# Patient Record
Sex: Male | Born: 1985 | Race: Black or African American | Hispanic: No | Marital: Single | Smoking: Never smoker
Health system: Southern US, Community
[De-identification: ages and names within clinical notes are randomized; demographics above are authoritative.]

## PROBLEM LIST (undated history)

## (undated) DIAGNOSIS — F1290 Cannabis use, unspecified, uncomplicated: Secondary | ICD-10-CM

## (undated) DIAGNOSIS — E669 Obesity, unspecified: Secondary | ICD-10-CM

## (undated) DIAGNOSIS — Z720 Tobacco use: Secondary | ICD-10-CM

## (undated) DIAGNOSIS — R519 Headache, unspecified: Secondary | ICD-10-CM

## (undated) HISTORY — PX: VENTRAL HERNIA REPAIR: SHX424

---

## 2010-12-09 ENCOUNTER — Emergency Department: Payer: Self-pay | Admitting: Emergency Medicine

## 2011-01-16 ENCOUNTER — Ambulatory Visit: Payer: Self-pay | Admitting: Family Medicine

## 2012-05-29 IMAGING — CT CT HEAD WITHOUT CONTRAST
1 series · 16 of 29 positions shown, 20 images · non-contrast
Comparison: none

REASON FOR EXAM: Headache Eval  Subdural Hemotoma
COMMENTS:

PROCEDURE:     KCT - KCT HEAD WITHOUT CONTRAST  - January 16, 2011  [DATE]
RESULT:     Comparison:  None
TECHNIQUE: Multiple axial images from the foramen magnum to the vertex were
obtained without IV contrast.

[Series 2: soft tissue · axial · 0.43mm/px · z∈[-136,-6]mm · 16 of 29 slices shown, 20 images]
[im 2/29  brain]
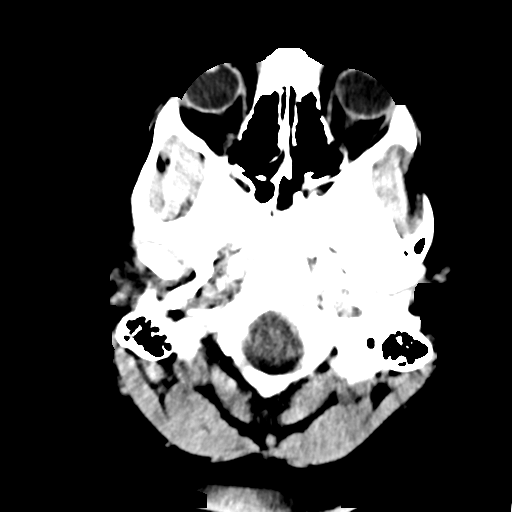
[im 2/29  bone]
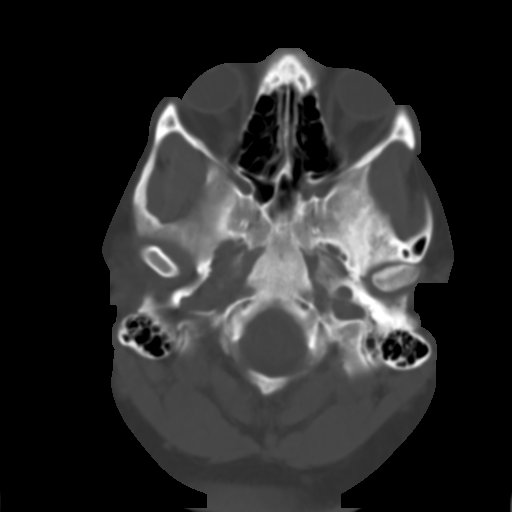
[im 4/29  brain]
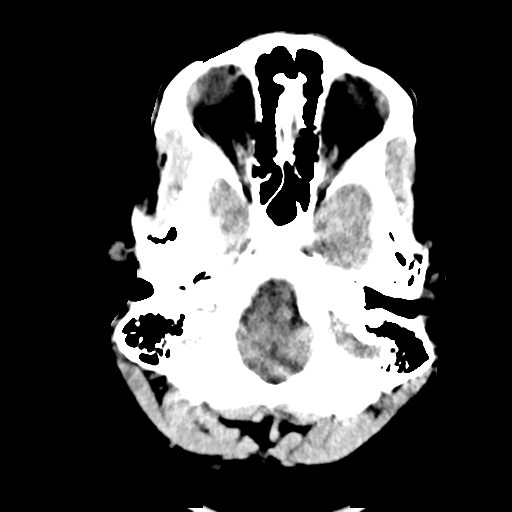
[im 6/29  brain]
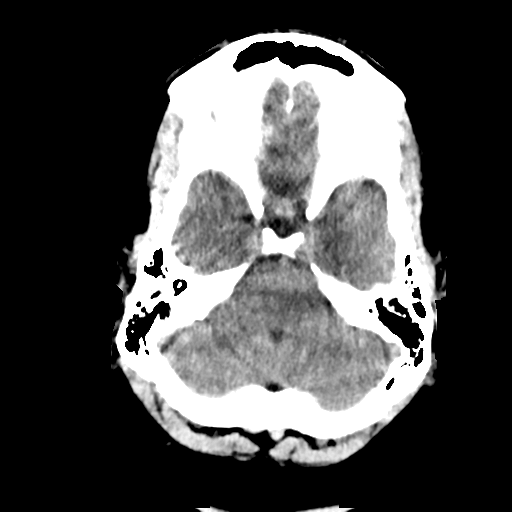
[im 7/29  brain]
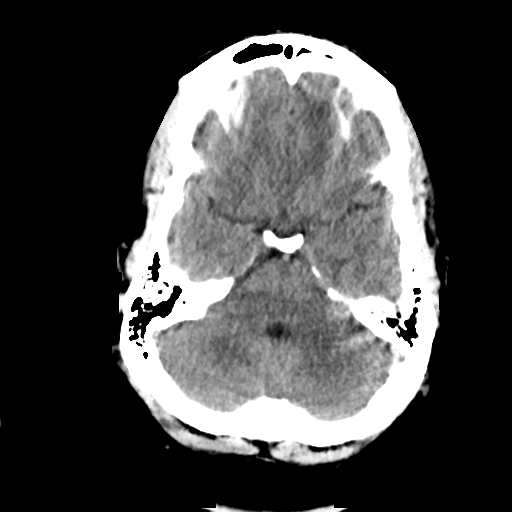
[im 9/29  brain]
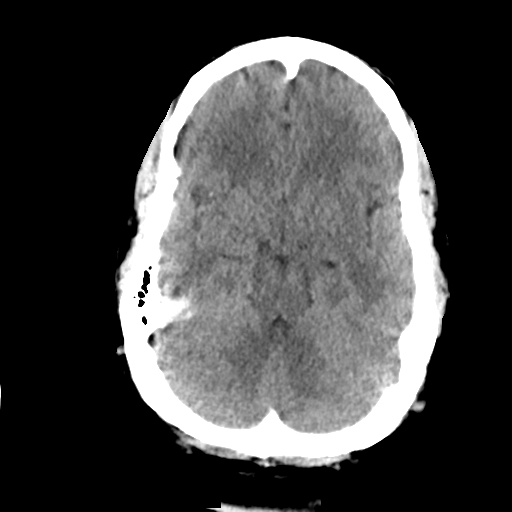
[im 9/29  bone]
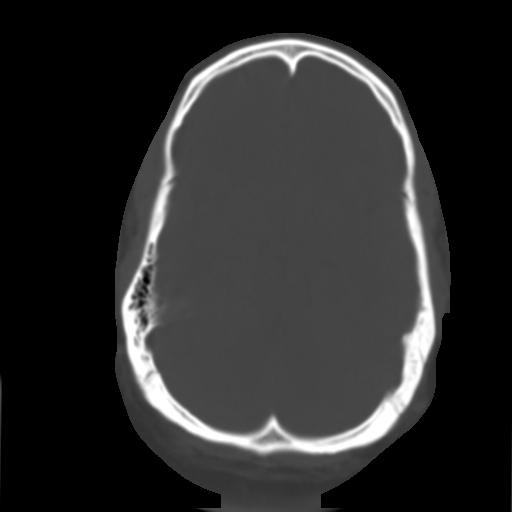
[im 11/29  brain]
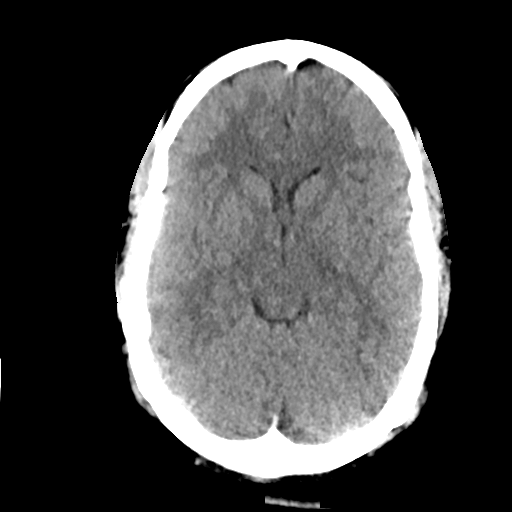
[im 12/29  brain]
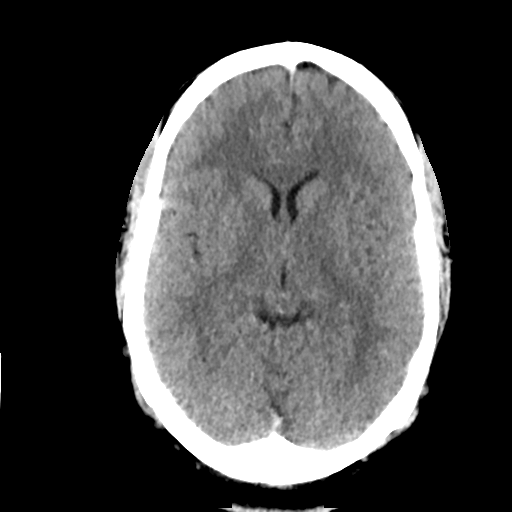
[im 14/29  brain]
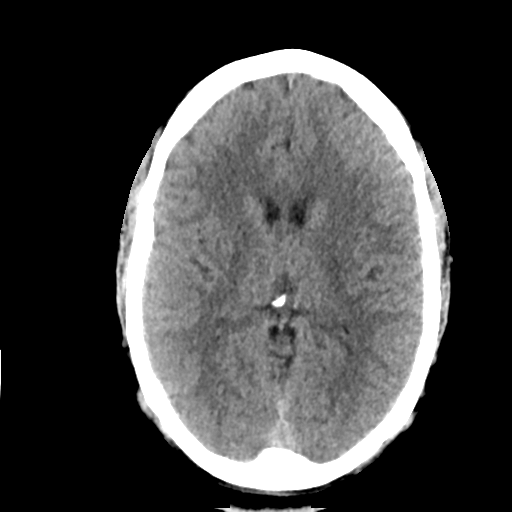
[im 16/29  brain]
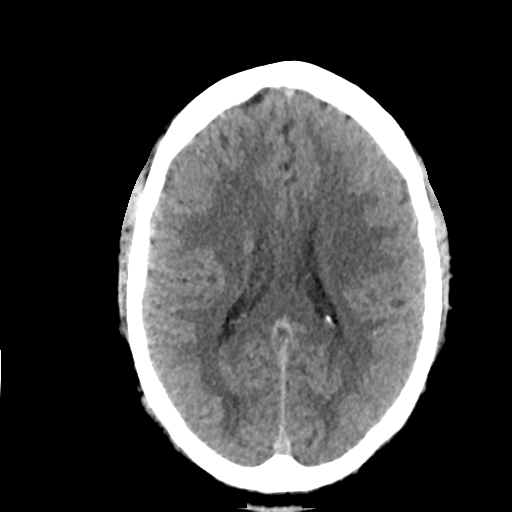
[im 16/29  bone]
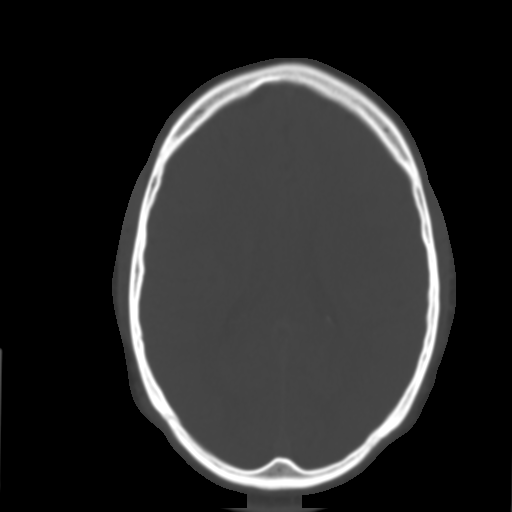
[im 18/29  brain]
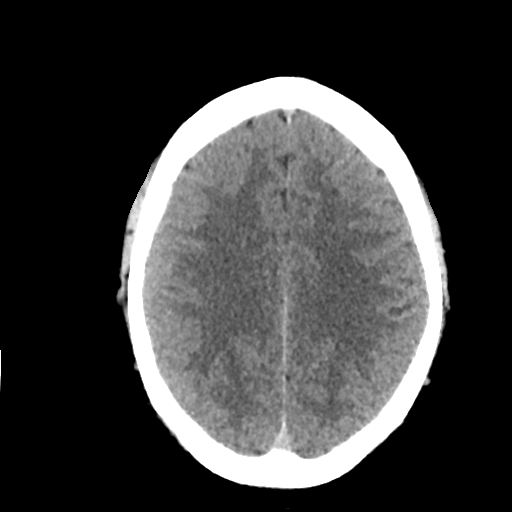
[im 19/29  brain]
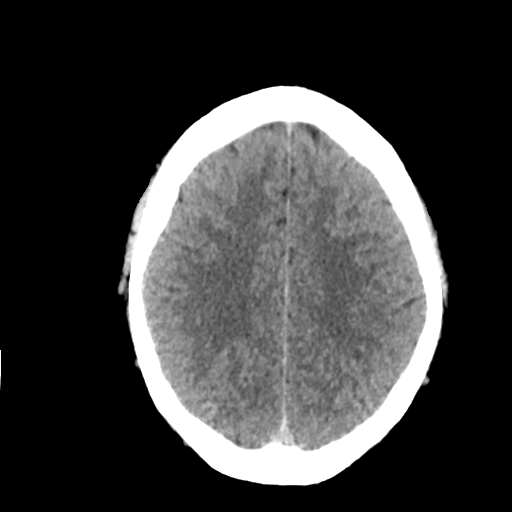
[im 21/29  brain]
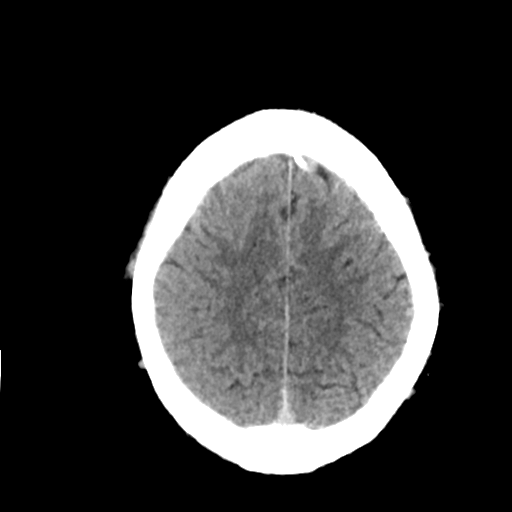
[im 23/29  brain]
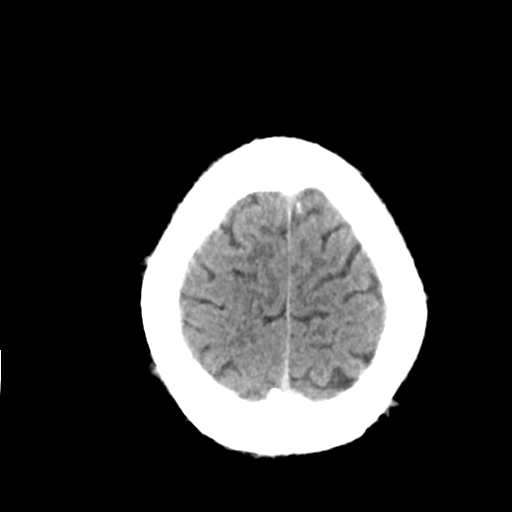
[im 23/29  bone]
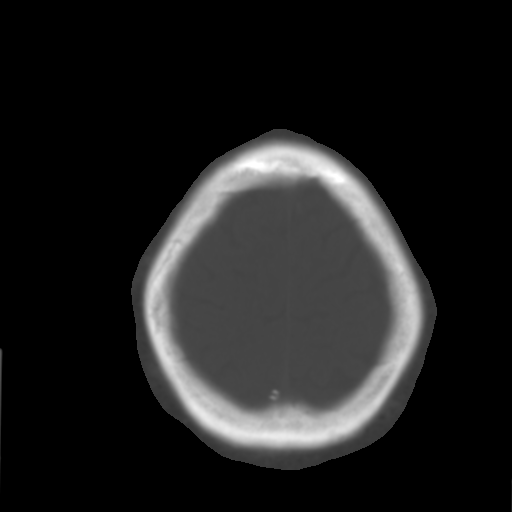
[im 24/29  brain]
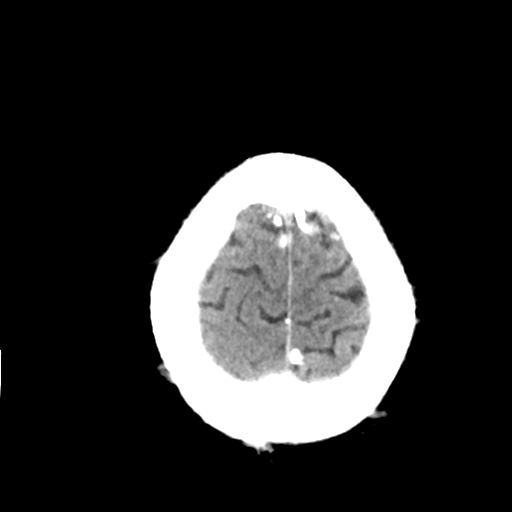
[im 26/29  brain]
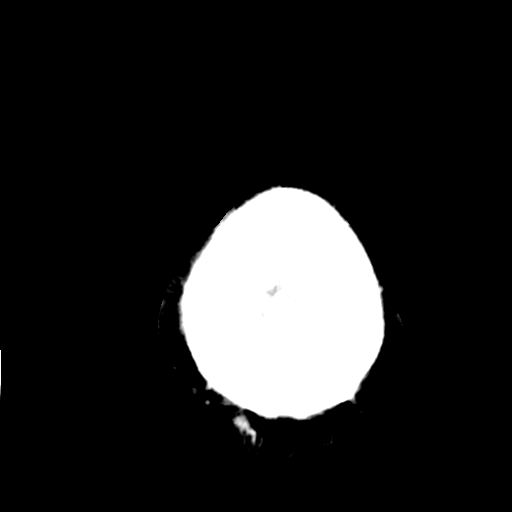
[im 28/29  brain]
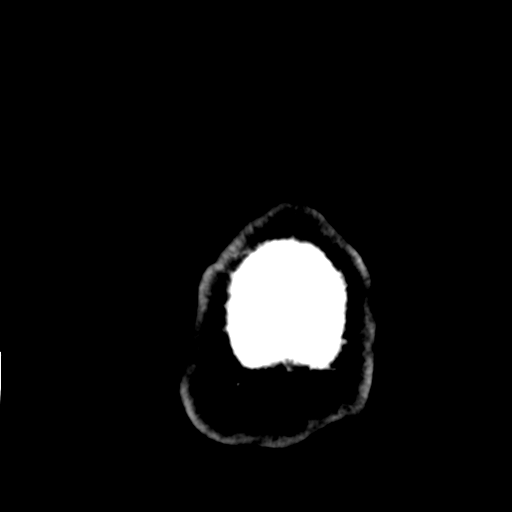

[16 of 29 positions shown; findings below may reference images not displayed]

FINDINGS: There is no evidence of mass effect, midline shift, or extra-axial fluid
collections.  There is no evidence of a space-occupying lesion or
intracranial hemorrhage. There is no evidence of a cortical-based area of
acute infarction.

The ventricles and sulci are appropriate for the patient's age. The basal
cisterns are patent.

Visualized portions of the orbits are unremarkable. The visualized portions
of the paranasal sinuses and mastoid air cells are unremarkable.

The osseous structures are unremarkable.
IMPRESSION: No acute intracranial process.

## 2020-01-09 ENCOUNTER — Ambulatory Visit: Payer: Self-pay | Admitting: Physician Assistant

## 2020-01-09 ENCOUNTER — Other Ambulatory Visit: Payer: Self-pay

## 2020-01-09 DIAGNOSIS — N341 Nonspecific urethritis: Secondary | ICD-10-CM

## 2020-01-09 DIAGNOSIS — Z113 Encounter for screening for infections with a predominantly sexual mode of transmission: Secondary | ICD-10-CM

## 2020-01-09 LAB — GRAM STAIN

## 2020-01-09 MED ORDER — AZITHROMYCIN 500 MG PO TABS
1000.0000 mg | ORAL_TABLET | Freq: Once | ORAL | Status: AC
  Administered 2020-01-09: 16:00:00 1000 mg via ORAL

## 2020-01-10 ENCOUNTER — Encounter: Payer: Self-pay | Admitting: Physician Assistant

## 2020-01-10 NOTE — Progress Notes (Signed)
   [facility] STI clinic/screening visit  Subjective:  Timo Hartwig is a 34 y.o. male being seen today for an STI screening visit. The patient reports they do have symptoms.    Patient has the following medical conditions:  There are no problems to display for this patient.    Chief Complaint  Patient presents with  . SEXUALLY TRANSMITTED DISEASE    HPI  Patient reports having clear/milky discharge from his penis for 3 days.  Denies other symptoms, chronic conditions and medications.   See flowsheet for further details and programmatic requirements.    The following portions of the patient's history were reviewed and updated as appropriate: allergies, current medications, past medical history, past social history, past surgical history and problem list.  Objective:  There were no vitals filed for this visit.  Physical Exam Constitutional:      General: He is not in acute distress.    Appearance: Normal appearance.  HENT:     Head: Normocephalic and atraumatic.     Comments: No nits, lice, or hair loss. No cervical, supraclavicular or axillary adenopathy.    Mouth/Throat:     Mouth: Mucous membranes are moist.     Pharynx: Oropharynx is clear. No oropharyngeal exudate or posterior oropharyngeal erythema.  Eyes:     Conjunctiva/sclera: Conjunctivae normal.  Pulmonary:     Effort: Pulmonary effort is normal.  Abdominal:     Palpations: Abdomen is soft. There is no mass.     Tenderness: There is no abdominal tenderness. There is no guarding or rebound.  Genitourinary:    Penis: Normal.      Testes: Normal.     Comments: Pubic area without nits, lice, edema, erythema, lesions and inguinal adenopathy. Penis circumcised, without lesions, rashes and discharge from meatus. Musculoskeletal:     Cervical back: Neck supple. No tenderness.  Skin:    General: Skin is warm and dry.     Findings: No bruising, erythema, lesion or rash.   Neurological:     Mental Status: He is alert and oriented to person, place, and time.  Psychiatric:        Mood and Affect: Mood normal.        Behavior: Behavior normal.        Thought Content: Thought content normal.        Judgment: Judgment normal.       Assessment and Plan:  Italo Banton is a 34 y.o. male presenting to the [facility] Department for STI screening  1. Screening for STD (sexually transmitted disease) Patient into clinic with symptoms. Rec condoms with all sex.  Await test results.  Counseled that RN will call if needs to RTC for further treatment once results are back.  - Gram stain - HIV [facility] - Syphilis Serology, [facility] - Gonococcus culture  2. NGU (nongonococcal urethritis) Reviewed Gram stain and will treat for NGU with Azithromycin 1 g po DOT. No sex for 7 days and until after partner completes treatment. RTC for re-treatment if vomits < 2 hr after taking medicine.  - azithromycin (ZITHROMAX) tablet 1,000 mg     No follow-ups on file.  No future appointments.  Matt Holmes, PA

## 2020-01-13 LAB — GONOCOCCUS CULTURE

## 2021-12-17 ENCOUNTER — Ambulatory Visit: Payer: Self-pay

## 2023-12-17 ENCOUNTER — Other Ambulatory Visit: Payer: Self-pay

## 2023-12-17 ENCOUNTER — Observation Stay
Admission: EM | Admit: 2023-12-17 | Discharge: 2023-12-19 | Disposition: A | Discharge status: Home / Self Care | Payer: Self-pay | Attending: Emergency Medicine | Admitting: Emergency Medicine

## 2023-12-17 DIAGNOSIS — R519 Headache, unspecified: Secondary | ICD-10-CM | POA: Insufficient documentation

## 2023-12-17 DIAGNOSIS — K922 Gastrointestinal hemorrhage, unspecified: Secondary | ICD-10-CM

## 2023-12-17 DIAGNOSIS — F17200 Nicotine dependence, unspecified, uncomplicated: Secondary | ICD-10-CM | POA: Insufficient documentation

## 2023-12-17 DIAGNOSIS — E669 Obesity, unspecified: Secondary | ICD-10-CM | POA: Insufficient documentation

## 2023-12-17 DIAGNOSIS — R197 Diarrhea, unspecified: Secondary | ICD-10-CM | POA: Insufficient documentation

## 2023-12-17 DIAGNOSIS — K920 Hematemesis: Secondary | ICD-10-CM

## 2023-12-17 DIAGNOSIS — K5730 Diverticulosis of large intestine without perforation or abscess without bleeding: Secondary | ICD-10-CM | POA: Insufficient documentation

## 2023-12-17 DIAGNOSIS — K226 Gastro-esophageal laceration-hemorrhage syndrome: Principal | ICD-10-CM | POA: Insufficient documentation

## 2023-12-17 DIAGNOSIS — Z720 Tobacco use: Secondary | ICD-10-CM | POA: Diagnosis present

## 2023-12-17 HISTORY — DX: Headache, unspecified: R51.9

## 2023-12-17 HISTORY — DX: Obesity, unspecified: E66.9

## 2023-12-17 HISTORY — DX: Tobacco use: Z72.0

## 2023-12-17 HISTORY — DX: Cannabis use, unspecified, uncomplicated: F12.90

## 2023-12-17 LAB — CBC
HCT: 44.7 % (ref 39.0–52.0)
Hemoglobin: 14.9 g/dL (ref 13.0–17.0)
MCH: 29.4 pg (ref 26.0–34.0)
MCHC: 33.3 g/dL (ref 30.0–36.0)
MCV: 88.2 fL (ref 80.0–100.0)
Platelets: 276 10*3/uL (ref 150–400)
RBC: 5.07 MIL/uL (ref 4.22–5.81)
RDW: 12.4 % (ref 11.5–15.5)
WBC: 11 10*3/uL — ABNORMAL HIGH (ref 4.0–10.5)
nRBC: 0 % (ref 0.0–0.2)

## 2023-12-17 LAB — COMPREHENSIVE METABOLIC PANEL
ALT: 30 U/L (ref 0–44)
AST: 25 U/L (ref 15–41)
Albumin: 4.7 g/dL (ref 3.5–5.0)
Alkaline Phosphatase: 64 U/L (ref 38–126)
Anion gap: 11 (ref 5–15)
BUN: 19 mg/dL (ref 6–20)
CO2: 23 mmol/L (ref 22–32)
Calcium: 9.1 mg/dL (ref 8.9–10.3)
Chloride: 103 mmol/L (ref 98–111)
Creatinine, Ser: 1.04 mg/dL (ref 0.61–1.24)
GFR, Estimated: >60 mL/min (ref >60)
Glucose, Bld: 130 mg/dL — ABNORMAL HIGH (ref 70–99)
Potassium: 3.8 mmol/L (ref 3.5–5.1)
Sodium: 137 mmol/L (ref 135–145)
Total Bilirubin: 1.1 mg/dL (ref <1.2)
Total Protein: 8.1 g/dL (ref 6.5–8.1)

## 2023-12-17 LAB — TYPE AND SCREEN
ABO/RH(D): O POS
Antibody Screen: NEGATIVE

## 2023-12-17 MED ORDER — PANTOPRAZOLE SODIUM 40 MG IV SOLR
80.0000 mg | Freq: Once | INTRAVENOUS | Status: AC
  Administered 2023-12-18: 80 mg via INTRAVENOUS
  Filled 2023-12-17: qty 20

## 2023-12-17 MED ORDER — ONDANSETRON HCL 4 MG/2ML IJ SOLN
4.0000 mg | Freq: Once | INTRAMUSCULAR | Status: AC
Start: 2023-12-18 — End: 2023-12-18
  Administered 2023-12-18: 4 mg via INTRAVENOUS
  Filled 2023-12-17: qty 2

## 2023-12-17 NOTE — ED Triage Notes (Signed)
Pt to ed from home via POV for GI Bleed. Pt has been sick recently with N/V and today started throwing up some blood. Pt is caox4, in no acute distress and ambulatory in triage.

## 2023-12-17 NOTE — ED Provider Notes (Signed)
[facility] Provider Note    Event Date/Time   First MD Initiated Contact with Patient 12/17/23 2310     (approximate)   History   Hematemesis   HPI  Gabriel Moore is a 37 y.o. male who presents to the ED for evaluation of Hematemesis   Patient presents for evaluation of recurrent hematemesis in the past 1 day.  Reports about 4 episodes of frank bloody emesis.  Some upper abdominal burning sensation.  Reports a childhood hernia repair but no other intra-abdominal surgical history. Nondrinker.  No EGD  Physical Exam   Triage Vital Signs: ED Triage Vitals  Encounter Vitals Group     BP 12/17/23 1737 (!) 139/98     Systolic BP Percentile --      Diastolic BP Percentile --      Pulse Rate 12/17/23 1737 (!) 119     Resp 12/17/23 1737 17     Temp 12/17/23 1737 99.6 F (37.6 C)     Temp Source 12/17/23 1737 Oral     SpO2 12/17/23 1737 99 %     Weight 12/17/23 1738 270 lb (122.5 kg)     Height 12/17/23 1738 6\' 3"  (1.905 m)     Head Circumference --      Peak Flow --      Pain Score 12/17/23 1736 0     Pain Loc --      Pain Education --      Exclude from Growth Chart --     Most recent vital signs: Vitals:   12/17/23 1737 12/17/23 2313  BP: (!) 139/98 (!) 128/92  Pulse: (!) 119 (!) 116  Resp: 17 16  Temp: 99.6 F (37.6 C) 98.2 F (36.8 C)  SpO2: 99% 100%    General: Awake, no distress.  CV:  Good peripheral perfusion.  Tachycardic Resp:  Normal effort.  Abd:  No distention.  Mild periumbilical and RUQ and LUQ tenderness.  No peritoneal features.  Benign lower abdomen. MSK:  No deformity noted.  Neuro:  No focal deficits appreciated. Other:     ED Results / Procedures / Treatments   Labs (all labs ordered are listed, but only abnormal results are displayed) Labs Reviewed  COMPREHENSIVE METABOLIC PANEL - Abnormal; Notable for the following components:      Result Value   Glucose, Bld 130 (*)    All other components within  normal limits  CBC - Abnormal; Notable for the following components:   WBC 11.0 (*)    All other components within normal limits  CBC - Abnormal; Notable for the following components:   WBC 10.7 (*)    Hemoglobin 12.9 (*)    HCT 38.0 (*)    All other components within normal limits  C DIFFICILE QUICK SCREEN W PCR REFLEX    HEMOGLOBIN AND HEMATOCRIT, BLOOD  LIPASE, BLOOD  PROTIME-INR  APTT  CBC  CBC  CBC  HIV ANTIBODY (ROUTINE TESTING W REFLEX)  BASIC METABOLIC PANEL  POC OCCULT BLOOD, ED  TYPE AND SCREEN    EKG   RADIOLOGY CT abd/pelv interpreted by me without signs of acute pathology  Official radiology report(s): CT ABDOMEN PELVIS W CONTRAST Result Date: 12/18/2023 CLINICAL DATA:  hematemesis, upper abd pain. eval gallstones, perf, etc EXAM: CT ABDOMEN AND PELVIS WITH CONTRAST TECHNIQUE: Multidetector CT imaging of the abdomen and pelvis was performed using the standard protocol following bolus administration of intravenous contrast. RADIATION DOSE REDUCTION: This exam was performed  according to the departmental dose-optimization program which includes automated exposure control, adjustment of the mA and/or kV according to patient size and/or use of iterative reconstruction technique. CONTRAST:  OMNIPAQUE IOHEXOL 300 MG/ML  SOLN COMPARISON:  None Available. FINDINGS: Lower chest: Punctate left lower lobe calcified pulmonary nodule (4:47). Possible tiny hiatal hernia. Hepatobiliary: Vague hypodensity along the falciform ligament likely focal fatty infiltration. No focal liver abnormality. No gallstones, gallbladder wall thickening, or pericholecystic fluid. No biliary dilatation. Pancreas: No focal lesion. Normal pancreatic contour. No surrounding inflammatory changes. No main pancreatic ductal dilatation. Spleen: Normal in size without focal abnormality. Adrenals/Urinary Tract: No adrenal nodule bilaterally. Bilateral kidneys enhance symmetrically. Subcentimeter hypodensity  too small to characterize-no further follow-up indicated. No hydronephrosis. No hydroureter. The urinary bladder is unremarkable. Stomach/Bowel: Stomach is within normal limits. No evidence of bowel wall thickening or dilatation. Colonic diverticulosis. Appendix appears normal. Vascular/Lymphatic: No abdominal aorta or iliac aneurysm. Trace atherosclerotic plaque of the aorta and its branches. No abdominal, pelvic, or inguinal lymphadenopathy. Reproductive: Prostate is unremarkable. Other: No intraperitoneal free fluid. No intraperitoneal free gas. No organized fluid collection. Musculoskeletal: No abdominal wall hernia or abnormality. No suspicious lytic or blastic osseous lesions. No acute displaced fracture. IMPRESSION: 1. Colonic diverticulosis with no acute diverticulitis. 2.  Aortic Atherosclerosis (ICD10-I70.0). Electronically Signed   By: Tish Frederickson M.D.   On: 12/18/2023 00:47    PROCEDURES and INTERVENTIONS:  Procedures  Medications  0.9 %  sodium chloride infusion (has no administration in time range)  ondansetron (ZOFRAN) injection 4 mg (has no administration in time range)  acetaminophen (TYLENOL) tablet 650 mg (has no administration in time range)  morphine (PF) 2 MG/ML injection 2 mg (has no administration in time range)  nicotine (NICODERM CQ - dosed in mg/24 hours) patch 21 mg (has no administration in time range)  sodium chloride 0.9 % bolus 2,000 mL (has no administration in time range)  pantoprazole (PROTONIX) injection 40 mg (has no administration in time range)    Followed by  pantoprazole (PROTONIX) injection 40 mg (has no administration in time range)  pantoprazole (PROTONIX) injection 80 mg (80 mg Intravenous Given 12/18/23 0025)  ondansetron (ZOFRAN) injection 4 mg (4 mg Intravenous Given 12/18/23 0025)  iohexol (OMNIPAQUE) 300 MG/ML solution 100 mL (100 mLs Intravenous Contrast Given 12/18/23 0029)     IMPRESSION / MDM / ASSESSMENT AND PLAN / ED COURSE  I  reviewed the triage vital signs and the nursing notes.  Differential diagnosis includes, but is not limited to, bleeding gastric ulcers, Mallory-Weiss tear, esophageal varices  {Patient presents with symptoms of an acute illness or injury that is potentially life-threatening.  Patient taking NSAIDs at home presents with hematemesis concerning for upper GI bleed.  Persistently tachycardic but hemodynamically stable.  Small hemoglobin drop of 1.5 points.  Normal metabolic panel, LFTs and lipase.  CT obtained due to his pain without evidence of acute pathology.  Start PPI and antiemetics.  Consult with medicine for admission for considerations of EGD in the morning  Clinical Course as of 12/18/23 0220  Sat Dec 18, 2023  0109 I consult with Dr. Clyde Lundborg, who agrees to admit [DS]    Clinical Course User Index [DS] Delton Prairie, MD     FINAL CLINICAL IMPRESSION(S) / ED DIAGNOSES   Final diagnoses:  Upper GI bleed  Hematemesis with nausea     Rx / DC Orders   ED Discharge Orders     None  Note:  This document was prepared using Dragon voice recognition software and may include unintentional dictation errors.   Delton Prairie, MD 12/18/23 [phone]

## 2023-12-18 ENCOUNTER — Observation Stay: Payer: Self-pay | Admitting: Anesthesiology

## 2023-12-18 ENCOUNTER — Emergency Department: Payer: Self-pay

## 2023-12-18 ENCOUNTER — Encounter: Payer: Self-pay | Admitting: Internal Medicine

## 2023-12-18 ENCOUNTER — Encounter
Admission: EM | Disposition: A | Discharge status: Home / Self Care | Payer: Self-pay | Source: Home / Self Care | Attending: Emergency Medicine

## 2023-12-18 DIAGNOSIS — Z720 Tobacco use: Secondary | ICD-10-CM | POA: Diagnosis present

## 2023-12-18 DIAGNOSIS — K922 Gastrointestinal hemorrhage, unspecified: Secondary | ICD-10-CM | POA: Diagnosis present

## 2023-12-18 DIAGNOSIS — R519 Headache, unspecified: Secondary | ICD-10-CM | POA: Diagnosis present

## 2023-12-18 DIAGNOSIS — E669 Obesity, unspecified: Secondary | ICD-10-CM | POA: Diagnosis present

## 2023-12-18 DIAGNOSIS — R197 Diarrhea, unspecified: Secondary | ICD-10-CM | POA: Diagnosis present

## 2023-12-18 DIAGNOSIS — K920 Hematemesis: Secondary | ICD-10-CM

## 2023-12-18 HISTORY — PX: ESOPHAGOGASTRODUODENOSCOPY (EGD) WITH PROPOFOL: SHX5813

## 2023-12-18 LAB — CBC
HCT: 38 % — ABNORMAL LOW (ref 39.0–52.0)
HCT: 33.5 % — ABNORMAL LOW (ref 39.0–52.0)
HCT: 32.2 % — ABNORMAL LOW (ref 39.0–52.0)
Hemoglobin: 12.9 g/dL — ABNORMAL LOW (ref 13.0–17.0)
Hemoglobin: 11.2 g/dL — ABNORMAL LOW (ref 13.0–17.0)
Hemoglobin: 10.9 g/dL — ABNORMAL LOW (ref 13.0–17.0)
MCH: 29.5 pg (ref 26.0–34.0)
MCH: 29.9 pg (ref 26.0–34.0)
MCH: 29.3 pg (ref 26.0–34.0)
MCHC: 33.9 g/dL (ref 30.0–36.0)
MCHC: 33.4 g/dL (ref 30.0–36.0)
MCHC: 33.9 g/dL (ref 30.0–36.0)
MCV: 87 fL (ref 80.0–100.0)
MCV: 89.3 fL (ref 80.0–100.0)
MCV: 86.6 fL (ref 80.0–100.0)
Platelets: 283 10*3/uL (ref 150–400)
Platelets: 225 10*3/uL (ref 150–400)
Platelets: 236 10*3/uL (ref 150–400)
RBC: 4.37 MIL/uL (ref 4.22–5.81)
RBC: 3.75 MIL/uL — ABNORMAL LOW (ref 4.22–5.81)
RBC: 3.72 MIL/uL — ABNORMAL LOW (ref 4.22–5.81)
RDW: 12.5 % (ref 11.5–15.5)
RDW: 12.7 % (ref 11.5–15.5)
RDW: 12.8 % (ref 11.5–15.5)
WBC: 10.7 10*3/uL — ABNORMAL HIGH (ref 4.0–10.5)
WBC: 6.2 10*3/uL (ref 4.0–10.5)
WBC: 6.1 10*3/uL (ref 4.0–10.5)
nRBC: 0 % (ref 0.0–0.2)
nRBC: 0 % (ref 0.0–0.2)
nRBC: 0 % (ref 0.0–0.2)

## 2023-12-18 LAB — BASIC METABOLIC PANEL
Anion gap: 9 (ref 5–15)
BUN: 19 mg/dL (ref 6–20)
CO2: 23 mmol/L (ref 22–32)
Calcium: 7.7 mg/dL — ABNORMAL LOW (ref 8.9–10.3)
Chloride: 103 mmol/L (ref 98–111)
Creatinine, Ser: 0.94 mg/dL (ref 0.61–1.24)
GFR, Estimated: >60 mL/min (ref >60)
Glucose, Bld: 97 mg/dL (ref 70–99)
Potassium: 3.6 mmol/L (ref 3.5–5.1)
Sodium: 135 mmol/L (ref 135–145)

## 2023-12-18 LAB — C DIFFICILE QUICK SCREEN W PCR REFLEX
C Diff antigen: NEGATIVE
C Diff interpretation: NOT DETECTED
C Diff toxin: NEGATIVE

## 2023-12-18 LAB — HEMOGLOBIN AND HEMATOCRIT, BLOOD
HCT: 39.4 % (ref 39.0–52.0)
Hemoglobin: 13.4 g/dL (ref 13.0–17.0)

## 2023-12-18 LAB — CBG MONITORING, ED
Glucose-Capillary: 114 mg/dL — ABNORMAL HIGH (ref 70–99)
Glucose-Capillary: 95 mg/dL (ref 70–99)

## 2023-12-18 LAB — PROTIME-INR
INR: 1.1 (ref 0.8–1.2)
Prothrombin Time: 14.7 s (ref 11.4–15.2)

## 2023-12-18 LAB — HIV ANTIBODY (ROUTINE TESTING W REFLEX): HIV Screen 4th Generation wRfx: NONREACTIVE

## 2023-12-18 LAB — LIPASE, BLOOD: Lipase: 24 U/L (ref 11–51)

## 2023-12-18 LAB — APTT: aPTT: 27 s (ref 24–36)

## 2023-12-18 SURGERY — ESOPHAGOGASTRODUODENOSCOPY (EGD) WITH PROPOFOL
Anesthesia: General

## 2023-12-18 MED ORDER — PROPOFOL 10 MG/ML IV BOLUS
INTRAVENOUS | Status: AC
  Filled 2023-12-18: qty 20

## 2023-12-18 MED ORDER — PIPERACILLIN-TAZOBACTAM 3.375 G IVPB
INTRAVENOUS | Status: AC
  Filled 2023-12-18: qty 50

## 2023-12-18 MED ORDER — DEXMEDETOMIDINE HCL IN NACL 80 MCG/20ML IV SOLN
INTRAVENOUS | Status: DC | PRN
  Administered 2023-12-18: 16 ug via INTRAVENOUS
  Administered 2023-12-18: 4 ug via INTRAVENOUS

## 2023-12-18 MED ORDER — MORPHINE SULFATE (PF) 2 MG/ML IV SOLN: 2.0000 mg | INTRAVENOUS | Status: DC | PRN

## 2023-12-18 MED ORDER — PANTOPRAZOLE SODIUM 40 MG IV SOLR: 40.0000 mg | Freq: Two times a day (BID) | INTRAVENOUS | Status: DC

## 2023-12-18 MED ORDER — SODIUM CHLORIDE 0.9 % IV BOLUS
2000.0000 mL | Freq: Once | INTRAVENOUS | Status: AC
  Administered 2023-12-18: 2000 mL via INTRAVENOUS

## 2023-12-18 MED ORDER — PANTOPRAZOLE INFUSION (NEW) - SIMPLE MED: 8.0000 mg/h | INTRAVENOUS | Status: DC

## 2023-12-18 MED ORDER — ACETAMINOPHEN 325 MG PO TABS
650.0000 mg | ORAL_TABLET | Freq: Four times a day (QID) | ORAL | Status: DC | PRN
  Administered 2023-12-18 (×2): 650 mg via ORAL
  Filled 2023-12-18 (×2): qty 2

## 2023-12-18 MED ORDER — SODIUM CHLORIDE 0.9 % IV SOLN
INTRAVENOUS | Status: AC
  Administered 2023-12-18: 10 mL/h via INTRAVENOUS
  Administered 2023-12-18: 1000 mL via INTRAVENOUS

## 2023-12-18 MED ORDER — ONDANSETRON HCL 4 MG/2ML IJ SOLN: 4.0000 mg | Freq: Three times a day (TID) | INTRAMUSCULAR | Status: DC | PRN

## 2023-12-18 MED ORDER — PANTOPRAZOLE SODIUM 40 MG IV SOLR
40.0000 mg | Freq: Four times a day (QID) | INTRAVENOUS | Status: DC
  Administered 2023-12-18 – 2023-12-19 (×6): 40 mg via INTRAVENOUS
  Filled 2023-12-18 (×6): qty 10

## 2023-12-18 MED ORDER — IOHEXOL 300 MG/ML  SOLN
100.0000 mL | Freq: Once | INTRAMUSCULAR | Status: AC | PRN
  Administered 2023-12-18: 100 mL via INTRAVENOUS

## 2023-12-18 MED ORDER — SODIUM CHLORIDE 0.9 % IV BOLUS
1000.0000 mL | Freq: Once | INTRAVENOUS | Status: DC
Start: 2023-12-18 — End: 2023-12-18

## 2023-12-18 MED ORDER — NICOTINE 21 MG/24HR TD PT24: 21.0000 mg | MEDICATED_PATCH | Freq: Every day | TRANSDERMAL | Status: DC

## 2023-12-18 MED ORDER — PROPOFOL 10 MG/ML IV BOLUS
INTRAVENOUS | Status: DC | PRN
  Administered 2023-12-18 (×2): 20 mg via INTRAVENOUS
  Administered 2023-12-18 (×2): 30 mg via INTRAVENOUS
  Administered 2023-12-18: 20 mg via INTRAVENOUS
  Administered 2023-12-18: 100 mg via INTRAVENOUS

## 2023-12-18 NOTE — Anesthesia Postprocedure Evaluation (Signed)
Anesthesia Post Note  Patient: Jubril Stout  Procedure(s) Performed: ESOPHAGOGASTRODUODENOSCOPY (EGD) WITH PROPOFOL  Patient location during evaluation: PACU Anesthesia Type: General Level of consciousness: awake and alert Pain management: pain level controlled Vital Signs Assessment: post-procedure vital signs reviewed and stable Respiratory status: spontaneous breathing, nonlabored ventilation and respiratory function stable Cardiovascular status: blood pressure returned to baseline and stable Postop Assessment: no apparent nausea or vomiting Anesthetic complications: no   No notable events documented.   Last Vitals:  Vitals:   12/18/23 0947 12/18/23 0957  BP: 114/73 119/78  Pulse: 96 93  Resp: 20 20  Temp:    SpO2: 99% 99%    Last Pain:  Vitals:   12/18/23 0957  TempSrc:   PainSc: 0-No pain                 Foye Deer

## 2023-12-18 NOTE — Transfer of Care (Signed)
Immediate Anesthesia Transfer of Care Note  Patient: Akiel Mares  Procedure(s) Performed: ESOPHAGOGASTRODUODENOSCOPY (EGD) WITH PROPOFOL  Patient Location: PACU and Endoscopy Unit  Anesthesia Type:General  Level of Consciousness: drowsy and patient cooperative  Airway & Oxygen Therapy: Patient Spontanous Breathing and Patient connected to nasal cannula oxygen  Post-op Assessment: Report given to RN and Post -op Vital signs reviewed and stable  Post vital signs: Reviewed and stable  Last Vitals:  Vitals Value Taken Time  BP 102/68 12/18/23 0945  Temp 36.9 C 12/18/23 0937  Pulse 93 12/18/23 0937  Resp 20 12/18/23 0945  SpO2 96 % 12/18/23 0945    Last Pain:  Vitals:   12/18/23 0937  TempSrc: Temporal  PainSc: Asleep         Complications: No notable events documented.

## 2023-12-18 NOTE — Consult Note (Signed)
Consultation  Referring Provider:   Hospitalist   Admit date: 12/17/2023 Consult date: 12/17/2023         Reason for Consultation: Hematemesis              HPI:   Gabriel Moore is a 37 y.o. gentleman with history of obesity, marijuana use, and tobacco use here for hematemesis. States that he had an upset stomach at work and took an aspirin, tums, and ginger ale and then vomited clear emesis. When he got home he vomited blood three times which was initially bright red and then more clot like material. He states he has been taking some NSAIDS for a few weeks due to a cold/sinus issues. Denies any other blood thinners. No significant alcohol use. States family history of colon cancer in second degree relatives. Had a hernia repair as a child. Does not take any prescription medicines. He notes possible dark red stools since he has been in the ED.  Past Medical History:  Diagnosis Date   Headache    Marijuana use    Obesity (BMI 30-39.9)    Tobacco abuse     Past Surgical History:  Procedure Laterality Date   VENTRAL HERNIA REPAIR      Family History  Problem Relation Age of Onset   COPD Mother    Diabetes Father     Social History   Tobacco Use   Smoking status: Every Day    Types: Cigarettes   Smokeless tobacco: Never  Substance Use Topics   Alcohol use: Not Currently    Comment: occasionally   Drug use: Not Currently    Types: Marijuana    Prior to Admission medications   Medication Sig Start Date End Date Taking? Authorizing Provider  aspirin 500 MG EC tablet Take 500 mg by mouth 3 (three) times daily.   Yes [provider]  Cholecalciferol (VITAMIN D3) 50 MCG (2000 UT) TABS Take 1 tablet by mouth daily.   Yes [provider]    Current Facility-Administered Medications  Medication Dose Route Frequency Provider Last Rate Last Admin   0.9 %  sodium chloride infusion   Intravenous Continuous Lorretta Harp, MD 75 mL/hr at 12/18/23 0219 1,000 mL at  12/18/23 4034   acetaminophen (TYLENOL) tablet 650 mg  650 mg Oral Q6H PRN Lorretta Harp, MD   650 mg at 12/18/23 0344   morphine (PF) 2 MG/ML injection 2 mg  2 mg Intravenous Q4H PRN Lorretta Harp, MD       nicotine (NICODERM CQ - dosed in mg/24 hours) patch 21 mg  21 mg Transdermal Daily Lorretta Harp, MD       ondansetron [facility]) injection 4 mg  4 mg Intravenous Q8H PRN Lorretta Harp, MD       pantoprazole (PROTONIX) injection 40 mg  40 mg Intravenous Q6H Otelia Sergeant, RPH   40 mg at 12/18/23 0520   Followed by   Melene Muller ON 12/21/2023] pantoprazole (PROTONIX) injection 40 mg  40 mg Intravenous Q12H Otelia Sergeant, RPH       Current Outpatient Medications  Medication Sig Dispense Refill   aspirin 500 MG EC tablet Take 500 mg by mouth 3 (three) times daily.     Cholecalciferol (VITAMIN D3) 50 MCG (2000 UT) TABS Take 1 tablet by mouth daily.      Allergies as of 12/17/2023 - Reviewed 12/17/2023  Allergen Reaction Noted   Penicillins  01/10/2020     Review of Systems:  All systems reviewed and negative except where noted in HPI.  Review of Systems  Constitutional:  Negative for chills and fever.  Respiratory:  Negative for shortness of breath.   Cardiovascular:  Negative for chest pain.  Gastrointestinal:  Positive for blood in stool and vomiting. Negative for abdominal pain, constipation and diarrhea.  Musculoskeletal:  Negative for joint pain.  Skin:  Negative for rash.  Neurological:  Negative for focal weakness.  Psychiatric/Behavioral:  Negative for substance abuse.   All other systems reviewed and are negative.      Physical Exam:  Vital signs in last 24 hours: Temp:  [98.2 F (36.8 C)-99.6 F (37.6 C)] 98.7 F (37.1 C) (12/28 0734) Pulse Rate:  [92-119] 92 (12/28 0734) Resp:  [14-18] 18 (12/28 0734) BP: (128-146)/(92-98) 131/97 (12/28 0734) SpO2:  [94 %-100 %] 94 % (12/28 0734) Weight:  [122.5 kg] 122.5 kg (12/27 1738) Last BM Date : 12/17/23 General:   Pleasant in  NAD Head:  Normocephalic and atraumatic. Eyes:   No icterus.   Conjunctiva pink. Mouth: Mucosa pink moist, no lesions. Neck:  Supple; no masses felt Lungs: No respiratory distress Abdomen:   Flat, soft, nondistended, nontender Msk:  No clubbing or cyanosis. Strength 5/5. Symmetrical without gross deformities. Neurologic:  Alert and  oriented x4;  No focal deficits Skin:  Warm, dry, pink without significant lesions or rashes. Psych:  Alert and cooperative. Normal affect.  LAB RESULTS: Recent Labs    12/17/23 1737 12/18/23 0023 12/18/23 0127  WBC 11.0*  --  10.7*  HGB 14.9 13.4 12.9*  HCT 44.7 39.4 38.0*  PLT 276  --  283   BMET Recent Labs    12/17/23 1737  NA 137  K 3.8  CL 103  CO2 23  GLUCOSE 130*  BUN 19  CREATININE 1.04  CALCIUM 9.1   LFT Recent Labs    12/17/23 1737  PROT 8.1  ALBUMIN 4.7  AST 25  ALT 30  ALKPHOS 64  BILITOT 1.1   PT/INR Recent Labs    12/18/23 0023  LABPROT 14.7  INR 1.1    STUDIES: CT ABDOMEN PELVIS W CONTRAST Result Date: 12/18/2023 CLINICAL DATA:  hematemesis, upper abd pain. eval gallstones, perf, etc EXAM: CT ABDOMEN AND PELVIS WITH CONTRAST TECHNIQUE: Multidetector CT imaging of the abdomen and pelvis was performed using the standard protocol following bolus administration of intravenous contrast. RADIATION DOSE REDUCTION: This exam was performed according to the departmental dose-optimization program which includes automated exposure control, adjustment of the mA and/or kV according to patient size and/or use of iterative reconstruction technique. CONTRAST:  OMNIPAQUE IOHEXOL 300 MG/ML  SOLN COMPARISON:  None Available. FINDINGS: Lower chest: Punctate left lower lobe calcified pulmonary nodule (4:47). Possible tiny hiatal hernia. Hepatobiliary: Vague hypodensity along the falciform ligament likely focal fatty infiltration. No focal liver abnormality. No gallstones, gallbladder wall thickening, or pericholecystic fluid. No  biliary dilatation. Pancreas: No focal lesion. Normal pancreatic contour. No surrounding inflammatory changes. No main pancreatic ductal dilatation. Spleen: Normal in size without focal abnormality. Adrenals/Urinary Tract: No adrenal nodule bilaterally. Bilateral kidneys enhance symmetrically. Subcentimeter hypodensity too small to characterize-no further follow-up indicated. No hydronephrosis. No hydroureter. The urinary bladder is unremarkable. Stomach/Bowel: Stomach is within normal limits. No evidence of bowel wall thickening or dilatation. Colonic diverticulosis. Appendix appears normal. Vascular/Lymphatic: No abdominal aorta or iliac aneurysm. Trace atherosclerotic plaque of the aorta and its branches. No abdominal, pelvic, or inguinal lymphadenopathy. Reproductive: Prostate is unremarkable. Other: No intraperitoneal free  fluid. No intraperitoneal free gas. No organized fluid collection. Musculoskeletal: No abdominal wall hernia or abnormality. No suspicious lytic or blastic osseous lesions. No acute displaced fracture. IMPRESSION: 1. Colonic diverticulosis with no acute diverticulitis. 2.  Aortic Atherosclerosis (ICD10-I70.0). Electronically Signed   By: Tish Frederickson M.D.   On: 12/18/2023 00:47       Impression / Plan:   37 y/o gentleman with history of NSAID use here with hematemesis concerning for PUD vs mallory weiss tear. No evidence of liver disease.  - will plan on EGD today - NPO - maintain active type and screen - IV PPI BID - transfuse to keep hemoglobin > 7 - further recs after procedure  Merlyn Lot MD, MPH [facility]

## 2023-12-18 NOTE — Anesthesia Preprocedure Evaluation (Addendum)
Anesthesia Evaluation  Patient identified by MRN, date of birth, ID band Patient awake    Reviewed: Allergy & Precautions, H&P , NPO status , Patient's Chart, lab work & pertinent test results  Airway Mallampati: III  TM Distance: >3 FB Neck ROM: full    Dental no notable dental hx.    Pulmonary Current Smoker   Pulmonary exam normal        Cardiovascular negative cardio ROS Normal cardiovascular exam     Neuro/Psych negative neurological ROS  negative psych ROS   GI/Hepatic negative GI ROS,,,(+)     substance abuse  marijuana use  Endo/Other  negative endocrine ROS    Renal/GU negative Renal ROS  negative genitourinary   Musculoskeletal   Abdominal  (+) + obese  Peds  Hematology negative hematology ROS (+)   Anesthesia Other Findings Hematemesis Patient states that he takes high dose of aspirin for headache, 500 mg to 3 times each day recently.  Past Medical History: No date: Headache No date: Marijuana use No date: Obesity (BMI 30-39.9) No date: Tobacco abuse  Past Surgical History: No date: VENTRAL HERNIA REPAIR  BMI    Body Mass Index: 33.75 kg/m      Reproductive/Obstetrics negative OB ROS                             Anesthesia Physical Anesthesia Plan  ASA: 2  Anesthesia Plan: General   Post-op Pain Management: Minimal or no pain anticipated   Induction: Intravenous  PONV Risk Score and Plan: Propofol infusion and TIVA  Airway Management Planned: Natural Airway and Nasal Cannula  Additional Equipment:   Intra-op Plan:   Post-operative Plan:   Informed Consent: I have reviewed the patients History and Physical, chart, labs and discussed the procedure including the risks, benefits and alternatives for the proposed anesthesia with the patient or authorized representative who has indicated his/her understanding and acceptance.     Dental Advisory  Given  Plan Discussed with: CRNA and Surgeon  Anesthesia Plan Comments:         Anesthesia Quick Evaluation

## 2023-12-18 NOTE — H&P (Signed)
History and Physical    Gabriel Moore QIH:[id number] DOB: 1986/03/11 DOA: 12/17/2023  Referring MD/NP/PA:   PCP: Pcp, No   Patient coming from:  The patient is coming from home.     Chief Complaint: Hematemesis  HPI: Gabriel Moore is a 37 y.o. male with medical history significant of headache, tobacco abuse, marijuana use, obesity, who presents with hematemesis.  Patient states that he he has had 4 episodes of nausea and hematemesis with dark blood and blood clot.  He has upper abdominal discomfort which is burning sensation.  He also reports diarrhea in the past 6 days, with 1-3 times of loose stool bowel movement each day.  He did not noticed blood in his stool at home, but had 1 episode of dark stool bowel movement in ED.  No chest pain or SOB.  Patient has mild dry cough.  No symptoms of UTI.  Denies heavy drinking of alcohol.  Patient states that he takes high dose of aspirin for headache, 500 mg to 3 times each day recently.  Data reviewed independently and ED Course: pt was found to have initial hemoglobin 14.9 --> 13.4, GFR> 60, temperature normal, blood pressure 128/92, heart rate 116, RR 16, oxygen saturation 100% on room air.  Patient is placed on MedSurg bed for observation.  Message sent to Dr. Mia Creek for GI for consult.  CT abdomen/pelvis:  1. Colonic diverticulosis with no acute diverticulitis. 2.  Aortic Atherosclerosis (ICD10-I70.0)   EKG:  Not done in ED   Review of Systems:   General: no fevers, chills, no body weight gain,  fatigue HEENT: no blurry vision, hearing changes or sore throat Respiratory: no dyspnea, coughing, wheezing CV: no chest pain, no palpitations GI: has nausea, hematemesis and upper abdominal discomfort, diarrhea GU: no dysuria, burning on urination, increased urinary frequency, hematuria  Ext: no leg edema Neuro: no unilateral weakness, numbness, or tingling, no vision change or hearing loss Skin: no rash, no skin tear. MSK:  No muscle spasm, no deformity, no limitation of range of movement in spin Heme: No easy bruising.  Travel history: No recent long distant travel.   Allergy:  Allergies  Allergen Reactions   Penicillins     Past Medical History:  Diagnosis Date   Headache    Marijuana use    Obesity (BMI 30-39.9)    Tobacco abuse     Past Surgical History:  Procedure Laterality Date   VENTRAL HERNIA REPAIR      Social History:  reports that he has been smoking cigarettes. He has never used smokeless tobacco. He reports that he does not currently use alcohol. He reports that he does not currently use drugs after having used the following drugs: Marijuana.  Family History:  Family History  Problem Relation Age of Onset   COPD Mother    Diabetes Father      Prior to Admission medications   Not on File    Physical Exam: Vitals:   12/17/23 1737 12/17/23 1738 12/17/23 2313  BP: (!) 139/98  (!) 128/92  Pulse: (!) 119  (!) 116  Resp: 17  16  Temp: 99.6 F (37.6 C)  98.2 F (36.8 C)  TempSrc: Oral  Oral  SpO2: 99%  100%  Weight:  122.5 kg   Height:  6\' 3"  (1.905 m)    General: Not in acute distress HEENT:       Eyes: PERRL, EOMI, no jaundice       ENT: No  discharge from the ears and nose, no pharynx injection, no tonsillar enlargement.        Neck: No JVD, no bruit, no mass felt. Heme: No neck lymph node enlargement. Cardiac: S1/S2, RRR, No murmurs, No gallops or rubs. Respiratory: No rales, wheezing, rhonchi or rubs. GI: Soft, nondistended, has mild tenderness in the upper abdomen, no rebound pain, no organomegaly, BS present. GU: No hematuria Ext: No pitting leg edema bilaterally. 1+DP/PT pulse bilaterally. Musculoskeletal: No joint deformities, No joint redness or warmth, no limitation of ROM in spin. Skin: No rashes.  Neuro: Alert, oriented X3, cranial nerves II-XII grossly intact, moves all extremities normally. Psych: Patient is not psychotic, no suicidal or hemocidal  ideation.  Labs on Admission: I have personally reviewed following labs and imaging studies  CBC: Recent Labs  Lab 12/17/23 1737 12/18/23 0023  WBC 11.0*  --   HGB 14.9 13.4  HCT 44.7 39.4  MCV 88.2  --   PLT 276  --    Basic Metabolic Panel: Recent Labs  Lab 12/17/23 1737  NA 137  K 3.8  CL 103  CO2 23  GLUCOSE 130*  BUN 19  CREATININE 1.04  CALCIUM 9.1   GFR: Estimated Creatinine Clearance: 138.5 mL/min (by C-G formula based on SCr of 1.04 mg/dL). Liver Function Tests: Recent Labs  Lab 12/17/23 1737  AST 25  ALT 30  ALKPHOS 64  BILITOT 1.1  PROT 8.1  ALBUMIN 4.7   Recent Labs  Lab 12/18/23 0023  LIPASE 24   No results for input(s): "AMMONIA" in the last 168 hours. Coagulation Profile: Recent Labs  Lab 12/18/23 0023  INR 1.1   Cardiac Enzymes: No results for input(s): "CKTOTAL", "CKMB", "CKMBINDEX", "TROPONINI" in the last 168 hours. BNP (last 3 results) No results for input(s): "PROBNP" in the last 8760 hours. HbA1C: No results for input(s): "HGBA1C" in the last 72 hours. CBG: No results for input(s): "GLUCAP" in the last 168 hours. Lipid Profile: No results for input(s): "CHOL", "HDL", "LDLCALC", "TRIG", "CHOLHDL", "LDLDIRECT" in the last 72 hours. Thyroid Function Tests: No results for input(s): "TSH", "T4TOTAL", "FREET4", "T3FREE", "THYROIDAB" in the last 72 hours. Anemia Panel: No results for input(s): "VITAMINB12", "FOLATE", "FERRITIN", "TIBC", "IRON", "RETICCTPCT" in the last 72 hours. Urine analysis: No results found for: "COLORURINE", "APPEARANCEUR", "LABSPEC", "PHURINE", "GLUCOSEU", "HGBUR", "BILIRUBINUR", "KETONESUR", "PROTEINUR", "UROBILINOGEN", "NITRITE", "LEUKOCYTESUR" Sepsis Labs: @LABRCNTIP (procalcitonin:4,lacticidven:4) )No results found for this or any previous visit (from the past 240 hours).   Radiological Exams on Admission: CT ABDOMEN PELVIS W CONTRAST Result Date: 12/18/2023 CLINICAL DATA:  hematemesis, upper abd  pain. eval gallstones, perf, etc EXAM: CT ABDOMEN AND PELVIS WITH CONTRAST TECHNIQUE: Multidetector CT imaging of the abdomen and pelvis was performed using the standard protocol following bolus administration of intravenous contrast. RADIATION DOSE REDUCTION: This exam was performed according to the departmental dose-optimization program which includes automated exposure control, adjustment of the mA and/or kV according to patient size and/or use of iterative reconstruction technique. CONTRAST:  OMNIPAQUE IOHEXOL 300 MG/ML  SOLN COMPARISON:  None Available. FINDINGS: Lower chest: Punctate left lower lobe calcified pulmonary nodule (4:47). Possible tiny hiatal hernia. Hepatobiliary: Vague hypodensity along the falciform ligament likely focal fatty infiltration. No focal liver abnormality. No gallstones, gallbladder wall thickening, or pericholecystic fluid. No biliary dilatation. Pancreas: No focal lesion. Normal pancreatic contour. No surrounding inflammatory changes. No main pancreatic ductal dilatation. Spleen: Normal in size without focal abnormality. Adrenals/Urinary Tract: No adrenal nodule bilaterally. Bilateral kidneys enhance symmetrically. Subcentimeter hypodensity too  small to characterize-no further follow-up indicated. No hydronephrosis. No hydroureter. The urinary bladder is unremarkable. Stomach/Bowel: Stomach is within normal limits. No evidence of bowel wall thickening or dilatation. Colonic diverticulosis. Appendix appears normal. Vascular/Lymphatic: No abdominal aorta or iliac aneurysm. Trace atherosclerotic plaque of the aorta and its branches. No abdominal, pelvic, or inguinal lymphadenopathy. Reproductive: Prostate is unremarkable. Other: No intraperitoneal free fluid. No intraperitoneal free gas. No organized fluid collection. Musculoskeletal: No abdominal wall hernia or abnormality. No suspicious lytic or blastic osseous lesions. No acute displaced fracture. IMPRESSION: 1. Colonic  diverticulosis with no acute diverticulitis. 2.  Aortic Atherosclerosis (ICD10-I70.0). Electronically Signed   By: Tish Frederickson M.D.   On: 12/18/2023 00:47      Assessment/Plan Principal Problem:   Upper GI bleeding Active Problems:   Diarrhea   Headache   Tobacco abuse   Obesity (BMI 30-39.9)   Assessment and Plan:  Upper GI bleeding: most likely due to high-dose aspirin use. Patient may have gastric ulcer versus gastritis.  Hemoglobin 14.9 --> 13.4.  Message sent to Dr. Mia Creek for GI for consult.   - will place in med-surg bed obs - NPO for possible EGD - IVF: 2L NS bolus, then at 100 mL/hr - Start IV pantoprazole   - Zofran IV for nausea - Avoid NSAIDs and SQ heparin - Maintain IV access (2 large bore IVs if possible). - Monitor closely and follow q6h cbc, transfuse as necessary, if Hgb<7.0 - LaB: INR, PTT and type screen  Diarrhea: Diarrhea started before hematemesis -Check C. Difficile  Headache -As needed Tylenol  Tobacco abuse -Nicotine patch -Did counseling about importance of quitting smoking  Obesity (BMI 30-39.9): Body weight 122.5 kg, BMI 33.75 -Encourage to lose weight -Exercise and healthy diet   DVT ppx: SCD  Code Status: Full code   Family Communication:     Yes, patient's father  at bed side.       Disposition Plan:  Anticipate discharge back to previous environment  Consults called: Message sent to Dr. Mia Creek of GI for consult  Admission status and Level of care: Med-Surg:     for obs    Dispo: The patient is from: Home              Anticipated d/c is to: Home              Anticipated d/c date is: 1 day              Patient currently is not medically stable to d/c.    Severity of Illness:  The appropriate patient status for this patient is OBSERVATION. Observation status is judged to be reasonable and necessary in order to provide the required intensity of service to ensure the patient's safety. The patient's presenting  symptoms, physical exam findings, and initial radiographic and laboratory data in the context of their medical condition is felt to place them at decreased risk for further clinical deterioration. Furthermore, it is anticipated that the patient will be medically stable for discharge from the hospital within 2 midnights of admission.        Date of Service 12/18/2023    Lorretta Harp [facility]   If 7PM-7AM, please contact night-coverage [email or web address] 12/18/2023, 1:35 AM

## 2023-12-18 NOTE — Care Plan (Signed)
Two small mallory-weiss tears in the esophagus with clot over them. No active bleeding.  Recommendations:  - PO PPI BID for at least two weeks - clears today, can advance to solids tomorrow if no issues overnight - PRN anti-emetics  Merlyn Lot MD, MPH [facility] GI

## 2023-12-18 NOTE — Op Note (Signed)
[facility] Gastroenterology Patient Name: Gabriel Moore Procedure Date: 12/18/2023 8:51 AM MRN: [id number] Account #: [id number] Date of Birth: [birth year] Admit Type: Outpatient Age: 37 Room: [facility] ENDO ROOM 4 Gender: Male Note Status: Finalized Instrument Name: Upper Endoscope [id number] Procedure:             Upper GI endoscopy Indications:           Hematemesis Providers:             Eather Colas MD, MD Medicines:             Monitored Anesthesia Care Complications:         No immediate complications. Procedure:             Pre-Anesthesia Assessment:                        - Prior to the procedure, a History and Physical was                         performed, and patient medications and allergies were                         reviewed. The patient is competent. The risks and                         benefits of the procedure and the sedation options and                         risks were discussed with the patient. All questions                         were answered and informed consent was obtained.                         Patient identification and proposed procedure were                         verified by the physician, the nurse, the                         anesthesiologist, the anesthetist and the technician                         in the endoscopy suite. Mental Status Examination:                         alert and oriented. Airway Examination: normal                         oropharyngeal airway and neck mobility. Respiratory                         Examination: clear to auscultation. CV Examination:                         normal. Prophylactic Antibiotics: The patient does not                         require prophylactic antibiotics. Prior  Anticoagulants: The patient has taken no anticoagulant                         or antiplatelet agents. ASA Grade Assessment: II - A                         patient with mild systemic disease.  After reviewing                         the risks and benefits, the patient was deemed in                         satisfactory condition to undergo the procedure. The                         anesthesia plan was to use monitored anesthesia care                         (MAC). Immediately prior to administration of                         medications, the patient was re-assessed for adequacy                         to receive sedatives. The heart rate, respiratory                         rate, oxygen saturations, blood pressure, adequacy of                         pulmonary ventilation, and response to care were                         monitored throughout the procedure. The physical                         status of the patient was re-assessed after the                         procedure.                        After obtaining informed consent, the endoscope was                         passed under direct vision. Throughout the procedure,                         the patient's blood pressure, pulse, and oxygen                         saturations were monitored continuously. The Endoscope                         was introduced through the mouth, and advanced to the                         second part of duodenum. The upper GI endoscopy was  accomplished without difficulty. The patient tolerated                         the procedure well. Findings:      Two small non-bleeding Mallory-Weiss tears with stigmata of recent       bleeding (adherent clot) was found.      The exam of the esophagus was otherwise normal.      The entire examined stomach was normal.      The examined duodenum was normal. Impression:            - Mallory-Weiss tears.                        - Normal stomach.                        - Normal examined duodenum.                        - No specimens collected. Recommendation:        - Return patient to hospital ward for ongoing care.                         - Clear liquid diet today.                        - Use a proton pump inhibitor PO BID for 2 weeks.                        - Recommend an anti-emetic/anti-nausea medication PRN. Procedure Code(s):     --- Professional ---                        [phone], Esophagogastroduodenoscopy, flexible,                         transoral; diagnostic, including collection of                         specimen(s) by brushing or washing, when performed                         (separate procedure) Diagnosis Code(s):     --- Professional ---                        K22.6, Gastro-esophageal laceration-hemorrhage syndrome                        K92.0, Hematemesis CPT copyright 2022 American Medical Association. All rights reserved. The codes documented in this report are preliminary and upon coder review may  be revised to meet current compliance requirements. Eather Colas MD, MD 12/18/2023 9:42:18 AM Number of Addenda: 0 Note Initiated On: 12/18/2023 8:51 AM Estimated Blood Loss:  Estimated blood loss: none.      [facility]

## 2023-12-18 NOTE — Progress Notes (Signed)
[facility] PROGRESS NOTE    Progress Note  Gabriel Moore  WJX:[id number] DOB: 1986/07/17 DOA: 12/17/2023 PCP: Oneita Hurt, No     Brief Narrative:   Gabriel Moore is an 37 y.o. male past medical history of obesity marijuana use and tobacco comes in for hematemesis and an upset stomach.  He has been taking high-dose aspirin 3 times a day for the last week he relates he has been having hematemesis with blood clots about 4 episodes, 1 episode of dark tarry stool in the ED, hemoglobin of 15 in the ED, repeated of 13, GI was consulted   Assessment/Plan:   Upper GI bleeding likely due to peptic ulcer disease: In the setting of high-dose aspirin. Placed NPO. Started on IV fluids and IV Protonix.  He is already been typed and screened. Zofran for nausea. Avoid NSAIDs or heparin's. GI was consulted recommended EGD. Continue to follow hemoglobin closely, transfuse if symptomatic or less than 7. He relates his abdominal pain is better having no further diarrhea.  Diarrhea: Likely due to acute GI bleed.  C. difficile PCR is negative. Diarrhea has resolved.  Tobacco abuse: Continue nicotine patch.  Obesity: Noted, he has been counseled.   DVT prophylaxis: lovenox Family Communication:none Status is: Observation The patient remains OBS appropriate and will d/c before 2 midnights.    Code Status:     Code Status Orders  (From admission, onward)           Start     Ordered   12/18/23 0125  Full code  Continuous       Question:  By:  Answer:  Consent: discussion documented in EHR   12/18/23 0125           Code Status History     This patient has a current code status but no historical code status.         IV Access:   Peripheral IV   Procedures and diagnostic studies:   CT ABDOMEN PELVIS W CONTRAST Result Date: 12/18/2023 CLINICAL DATA:  hematemesis, upper abd pain. eval gallstones, perf, etc EXAM: CT ABDOMEN AND PELVIS WITH CONTRAST  TECHNIQUE: Multidetector CT imaging of the abdomen and pelvis was performed using the standard protocol following bolus administration of intravenous contrast. RADIATION DOSE REDUCTION: This exam was performed according to the departmental dose-optimization program which includes automated exposure control, adjustment of the mA and/or kV according to patient size and/or use of iterative reconstruction technique. CONTRAST:  OMNIPAQUE IOHEXOL 300 MG/ML  SOLN COMPARISON:  None Available. FINDINGS: Lower chest: Punctate left lower lobe calcified pulmonary nodule (4:47). Possible tiny hiatal hernia. Hepatobiliary: Vague hypodensity along the falciform ligament likely focal fatty infiltration. No focal liver abnormality. No gallstones, gallbladder wall thickening, or pericholecystic fluid. No biliary dilatation. Pancreas: No focal lesion. Normal pancreatic contour. No surrounding inflammatory changes. No main pancreatic ductal dilatation. Spleen: Normal in size without focal abnormality. Adrenals/Urinary Tract: No adrenal nodule bilaterally. Bilateral kidneys enhance symmetrically. Subcentimeter hypodensity too small to characterize-no further follow-up indicated. No hydronephrosis. No hydroureter. The urinary bladder is unremarkable. Stomach/Bowel: Stomach is within normal limits. No evidence of bowel wall thickening or dilatation. Colonic diverticulosis. Appendix appears normal. Vascular/Lymphatic: No abdominal aorta or iliac aneurysm. Trace atherosclerotic plaque of the aorta and its branches. No abdominal, pelvic, or inguinal lymphadenopathy. Reproductive: Prostate is unremarkable. Other: No intraperitoneal free fluid. No intraperitoneal free gas. No organized fluid collection. Musculoskeletal: No abdominal wall hernia or abnormality. No suspicious lytic or blastic osseous  lesions. No acute displaced fracture. IMPRESSION: 1. Colonic diverticulosis with no acute diverticulitis. 2.  Aortic Atherosclerosis  (ICD10-I70.0). Electronically Signed   By: Tish Frederickson M.D.   On: 12/18/2023 00:47     Medical Consultants:   None.   Subjective:    Gabriel Moore   Objective:    Vitals:   12/17/23 1738 12/17/23 2313 12/18/23 0331 12/18/23 0734  BP:  (!) 128/92 (!) 146/96 (!) 131/97  Pulse:  (!) 116 (!) 101 92  Resp:  16 14 18   Temp:  98.2 F (36.8 C) 99.2 F (37.3 C) 98.7 F (37.1 C)  TempSrc:  Oral Oral Oral  SpO2:  100% 100% 94%  Weight: 122.5 kg     Height: 6\' 3"  (1.905 m)      SpO2: 94 %  No intake or output data in the 24 hours ending 12/18/23 0854 Filed Weights   12/17/23 1738  Weight: 122.5 kg    Exam: General exam: In no acute distress. Respiratory system: Good air movement and clear to auscultation. Cardiovascular system: S1 & S2 heard, RRR. No JVD. Gastrointestinal system: Abdomen is nondistended, soft and nontender.  Extremities: No pedal edema. Skin: No rashes, lesions or ulcers Psychiatry: Judgement and insight appear normal. Mood & affect appropriate.    Data Reviewed:    Labs: Basic Metabolic Panel: Recent Labs  Lab 12/17/23 1737  NA 137  K 3.8  CL 103  CO2 23  GLUCOSE 130*  BUN 19  CREATININE 1.04  CALCIUM 9.1   GFR Estimated Creatinine Clearance: 138.5 mL/min (by C-G formula based on SCr of 1.04 mg/dL). Liver Function Tests: Recent Labs  Lab 12/17/23 1737  AST 25  ALT 30  ALKPHOS 64  BILITOT 1.1  PROT 8.1  ALBUMIN 4.7   Recent Labs  Lab 12/18/23 0023  LIPASE 24   No results for input(s): "AMMONIA" in the last 168 hours. Coagulation profile Recent Labs  Lab 12/18/23 0023  INR 1.1   COVID-19 Labs  No results for input(s): "DDIMER", "FERRITIN", "LDH", "CRP" in the last 72 hours.  No results found for: "SARSCOV2NAA"  CBC: Recent Labs  Lab 12/17/23 1737 12/18/23 0023 12/18/23 0127  WBC 11.0*  --  10.7*  HGB 14.9 13.4 12.9*  HCT 44.7 39.4 38.0*  MCV 88.2  --  87.0  PLT 276  --  283   Cardiac  Enzymes: No results for input(s): "CKTOTAL", "CKMB", "CKMBINDEX", "TROPONINI" in the last 168 hours. BNP (last 3 results) No results for input(s): "PROBNP" in the last 8760 hours. CBG: Recent Labs  Lab 12/18/23 0742  GLUCAP 114*   D-Dimer: No results for input(s): "DDIMER" in the last 72 hours. Hgb A1c: No results for input(s): "HGBA1C" in the last 72 hours. Lipid Profile: No results for input(s): "CHOL", "HDL", "LDLCALC", "TRIG", "CHOLHDL", "LDLDIRECT" in the last 72 hours. Thyroid function studies: No results for input(s): "TSH", "T4TOTAL", "T3FREE", "THYROIDAB" in the last 72 hours.  Invalid input(s): "FREET3" Anemia work up: No results for input(s): "VITAMINB12", "FOLATE", "FERRITIN", "TIBC", "IRON", "RETICCTPCT" in the last 72 hours. Sepsis Labs: Recent Labs  Lab 12/17/23 1737 12/18/23 0127  WBC 11.0* 10.7*   Microbiology Recent Results (from the past 240 hours)  C Difficile Quick Screen w PCR reflex     Status: None   Collection Time: 12/18/23  6:01 AM   Specimen: STOOL  Result Value Ref Range Status   C Diff antigen NEGATIVE NEGATIVE Final   C Diff toxin NEGATIVE NEGATIVE  Final   C Diff interpretation No C. difficile detected.  Final    Comment: Performed at [facility], [address] [place], [place] [address]     Medications:    nicotine  21 mg Transdermal Daily   pantoprazole (PROTONIX) IV  40 mg Intravenous Q6H   Followed by   Melene Muller ON 12/21/2023] pantoprazole (PROTONIX) IV  40 mg Intravenous Q12H   Continuous Infusions:  sodium chloride 1,000 mL (12/18/23 0219)      LOS: 0 days   Marinda Elk  [facility]  12/18/2023, 8:54 AM

## 2023-12-18 NOTE — ED Notes (Signed)
Report given to EGD nurse

## 2023-12-19 LAB — CBC
HCT: 32.5 % — ABNORMAL LOW (ref 39.0–52.0)
Hemoglobin: 10.9 g/dL — ABNORMAL LOW (ref 13.0–17.0)
MCH: 29.6 pg (ref 26.0–34.0)
MCHC: 33.5 g/dL (ref 30.0–36.0)
MCV: 88.3 fL (ref 80.0–100.0)
Platelets: 242 10*3/uL (ref 150–400)
RBC: 3.68 MIL/uL — ABNORMAL LOW (ref 4.22–5.81)
RDW: 12.7 % (ref 11.5–15.5)
WBC: 5.3 10*3/uL (ref 4.0–10.5)
nRBC: 0 % (ref 0.0–0.2)

## 2023-12-19 LAB — CBG MONITORING, ED
Glucose-Capillary: 95 mg/dL (ref 70–99)
Glucose-Capillary: 90 mg/dL (ref 70–99)

## 2023-12-19 MED ORDER — PANTOPRAZOLE SODIUM 40 MG PO TBEC: 40.0000 mg | DELAYED_RELEASE_TABLET | Freq: Two times a day (BID) | ORAL | 1 refills | Status: AC

## 2023-12-19 NOTE — Discharge Summary (Signed)
Physician Discharge Summary  Gabriel Moore ZOX:[id number] DOB: 11/17/1986 DOA: 12/17/2023  PCP: Pcp, No  Admit date: 12/17/2023 Discharge date: 12/19/2023  Admitted From: Home Disposition:  Home  Recommendations for Outpatient Follow-up:  Follow up with GI in 1-2 weeks Please obtain BMP/CBC in one week   Home Health:No Equipment/Devices:None  Discharge Condition:Stable CODE STATUS:Full Diet recommendation: Heart Healthy   Brief/Interim Summary: 37 y.o. male past medical history of obesity marijuana use and tobacco comes in for hematemesis and an upset stomach.  He has been taking high-dose aspirin 3 times a day for the last week he relates he has been having hematemesis with blood clots about 4 episodes, 1 episode of dark tarry stool in the ED, hemoglobin of 15 in the ED, repeated of 13, GI was consulted   Discharge Diagnoses:  Principal Problem:   Upper GI bleeding Active Problems:   Diarrhea   Headache   Tobacco abuse   Obesity (BMI 30-39.9)  Acute upper GI bleed likely due to to small Mallory-Weiss tear over the esophagus: In the setting of NSAID use (aspirin). He was placed on IV Protonix NPO. GI was consulted perform an EGD that showed. He tolerated his diet he was discharged in stable condition.  Diarrhea: Likely due to GI bleed now resolved C. difficile PCR was negative.  Tobacco abuse: Continue nicotine patch.  Obesity: Noted  Discharge Instructions  Discharge Instructions     Diet - low sodium heart healthy   Complete by: As directed    Increase activity slowly   Complete by: As directed       Allergies as of 12/19/2023       Reactions   Penicillins         Medication List     STOP taking these medications    aspirin 500 MG EC tablet       TAKE these medications    pantoprazole 40 MG tablet Commonly known as: Protonix Take 1 tablet (40 mg total) by mouth 2 (two) times daily.   Vitamin D3 50 MCG (2000 UT) Tabs Take 1  tablet by mouth daily.        Allergies  Allergen Reactions   Penicillins     Consultations: Gastroenterology Dr. Mia Creek   Procedures/Studies: CT ABDOMEN PELVIS W CONTRAST Result Date: 12/18/2023 CLINICAL DATA:  hematemesis, upper abd pain. eval gallstones, perf, etc EXAM: CT ABDOMEN AND PELVIS WITH CONTRAST TECHNIQUE: Multidetector CT imaging of the abdomen and pelvis was performed using the standard protocol following bolus administration of intravenous contrast. RADIATION DOSE REDUCTION: This exam was performed according to the departmental dose-optimization program which includes automated exposure control, adjustment of the mA and/or kV according to patient size and/or use of iterative reconstruction technique. CONTRAST:  OMNIPAQUE IOHEXOL 300 MG/ML  SOLN COMPARISON:  None Available. FINDINGS: Lower chest: Punctate left lower lobe calcified pulmonary nodule (4:47). Possible tiny hiatal hernia. Hepatobiliary: Vague hypodensity along the falciform ligament likely focal fatty infiltration. No focal liver abnormality. No gallstones, gallbladder wall thickening, or pericholecystic fluid. No biliary dilatation. Pancreas: No focal lesion. Normal pancreatic contour. No surrounding inflammatory changes. No main pancreatic ductal dilatation. Spleen: Normal in size without focal abnormality. Adrenals/Urinary Tract: No adrenal nodule bilaterally. Bilateral kidneys enhance symmetrically. Subcentimeter hypodensity too small to characterize-no further follow-up indicated. No hydronephrosis. No hydroureter. The urinary bladder is unremarkable. Stomach/Bowel: Stomach is within normal limits. No evidence of bowel wall thickening or dilatation. Colonic diverticulosis. Appendix appears normal. Vascular/Lymphatic: No abdominal aorta or iliac  aneurysm. Trace atherosclerotic plaque of the aorta and its branches. No abdominal, pelvic, or inguinal lymphadenopathy. Reproductive: Prostate is unremarkable. Other:  No intraperitoneal free fluid. No intraperitoneal free gas. No organized fluid collection. Musculoskeletal: No abdominal wall hernia or abnormality. No suspicious lytic or blastic osseous lesions. No acute displaced fracture. IMPRESSION: 1. Colonic diverticulosis with no acute diverticulitis. 2.  Aortic Atherosclerosis (ICD10-I70.0). Electronically Signed   By: Tish Frederickson M.D.   On: 12/18/2023 00:47   (Echo, Carotid, EGD, Colonoscopy, ERCP)    Subjective: No complaints feels great  Discharge Exam: Vitals:   12/19/23 0331 12/19/23 0333  BP:  130/88  Pulse: 69 69  Resp: 18 19  Temp: 98.6 F (37 C) 98.6 F (37 C)  SpO2: 98% 98%   Vitals:   12/18/23 2359 12/19/23 0016 12/19/23 0331 12/19/23 0333  BP: (!) 139/100 130/88  130/88  Pulse: 70  69 69  Resp: 18  18 19   Temp:   98.6 F (37 C) 98.6 F (37 C)  TempSrc:   Oral Oral  SpO2: 100%  98% 98%  Weight:      Height:        General: Pt is alert, awake, not in acute distress Cardiovascular: RRR, S1/S2 +, no rubs, no gallops Respiratory: CTA bilaterally, no wheezing, no rhonchi Abdominal: Soft, NT, ND, bowel sounds + Extremities: no edema, no cyanosis    The results of significant diagnostics from this hospitalization (including imaging, microbiology, ancillary and laboratory) are listed below for reference.     Microbiology: Recent Results (from the past 240 hours)  C Difficile Quick Screen w PCR reflex     Status: None   Collection Time: 12/18/23  6:01 AM   Specimen: STOOL  Result Value Ref Range Status   C Diff antigen NEGATIVE NEGATIVE Final   C Diff toxin NEGATIVE NEGATIVE Final   C Diff interpretation No C. difficile detected.  Final    Comment: Performed at [facility], [address] [place], [place] [address]     Labs: BNP (last 3 results) No results for input(s): "BNP" in the last 8760 hours. Basic Metabolic Panel: Recent Labs  Lab 12/17/23 1737 12/18/23 1235  NA 137 135  K 3.8 3.6   CL 103 103  CO2 23 23  GLUCOSE 130* 97  BUN 19 19  CREATININE 1.04 0.94  CALCIUM 9.1 7.7*   Liver Function Tests: Recent Labs  Lab 12/17/23 1737  AST 25  ALT 30  ALKPHOS 64  BILITOT 1.1  PROT 8.1  ALBUMIN 4.7   Recent Labs  Lab 12/18/23 0023  LIPASE 24   No results for input(s): "AMMONIA" in the last 168 hours. CBC: Recent Labs  Lab 12/17/23 1737 12/18/23 0023 12/18/23 0127 12/18/23 1235 12/18/23 1949 12/19/23 0207  WBC 11.0*  --  10.7* 6.2 6.1 5.3  HGB 14.9 13.4 12.9* 11.2* 10.9* 10.9*  HCT 44.7 39.4 38.0* 33.5* 32.2* 32.5*  MCV 88.2  --  87.0 89.3 86.6 88.3  PLT 276  --  283 225 236 242   Cardiac Enzymes: No results for input(s): "CKTOTAL", "CKMB", "CKMBINDEX", "TROPONINI" in the last 168 hours. BNP: Invalid input(s): "POCBNP" CBG: Recent Labs  Lab 12/18/23 0742 12/18/23 1149  GLUCAP 114* 95   D-Dimer No results for input(s): "DDIMER" in the last 72 hours. Hgb A1c No results for input(s): "HGBA1C" in the last 72 hours. Lipid Profile No results for input(s): "CHOL", "HDL", "LDLCALC", "TRIG", "CHOLHDL", "LDLDIRECT" in the last 72 hours.  Thyroid function studies No results for input(s): "TSH", "T4TOTAL", "T3FREE", "THYROIDAB" in the last 72 hours.  Invalid input(s): "FREET3" Anemia work up No results for input(s): "VITAMINB12", "FOLATE", "FERRITIN", "TIBC", "IRON", "RETICCTPCT" in the last 72 hours. Urinalysis No results found for: "COLORURINE", "APPEARANCEUR", "LABSPEC", "PHURINE", "GLUCOSEU", "HGBUR", "BILIRUBINUR", "KETONESUR", "PROTEINUR", "UROBILINOGEN", "NITRITE", "LEUKOCYTESUR" Sepsis Labs Recent Labs  Lab 12/18/23 0127 12/18/23 1235 12/18/23 1949 12/19/23 0207  WBC 10.7* 6.2 6.1 5.3   Microbiology Recent Results (from the past 240 hours)  C Difficile Quick Screen w PCR reflex     Status: None   Collection Time: 12/18/23  6:01 AM   Specimen: STOOL  Result Value Ref Range Status   C Diff antigen NEGATIVE NEGATIVE Final   C Diff  toxin NEGATIVE NEGATIVE Final   C Diff interpretation No C. difficile detected.  Final    Comment: Performed at [facility], [address] [place], [place] [address]     Time coordinating discharge: Over 30 minutes  SIGNED:   Marinda Elk, MD  [facility] 12/19/2023, 6:42 AM Pager   If 7PM-7AM, please contact night-coverage [email or web address] Password [email or web address]

## 2023-12-20 ENCOUNTER — Encounter: Payer: Self-pay | Admitting: Gastroenterology

## 2024-07-12 ENCOUNTER — Emergency Department: Payer: Worker's Compensation

## 2024-07-12 ENCOUNTER — Emergency Department
Admission: EM | Admit: 2024-07-12 | Discharge: 2024-07-12 | Disposition: A | Discharge status: Home / Self Care | Payer: Worker's Compensation | Attending: Emergency Medicine | Admitting: Emergency Medicine

## 2024-07-12 DIAGNOSIS — S0990XA Unspecified injury of head, initial encounter: Secondary | ICD-10-CM | POA: Diagnosis present

## 2024-07-12 DIAGNOSIS — W208XXA Other cause of strike by thrown, projected or falling object, initial encounter: Secondary | ICD-10-CM | POA: Diagnosis not present

## 2024-07-12 NOTE — ED Triage Notes (Signed)
 Pt c/o headache after having a metal bracket/pulley sytem fell on his head.  Pain score 7/10.  Pt is unsure if it knocked him out.  Sts I know, I closed my eyes.    Pupils are even and reactive.  Pt is at neuro baseline.  Pt has a stutter at baseline.

## 2024-07-12 NOTE — Discharge Instructions (Signed)
 You were evaluated in the ED for a headache and/or head injury. Your evaluation did not reveal signs of a serious condition such as a brain bleed or skull fracture and were reassuring.  Your head CT and cervical spine CT were normal.  Consider these rest and recovery strategies at home:  - Avoid strenous activity , exercise or heavy lifting for a few days.  -Limit screen time and activities requiring intense focus.  - Gradually return to normal activities as symptoms improve   Take tylenol  as needed.   Apply ice/cold pack to the affected area 10-20 minutes every 2-3 hours for the first 24-48 hours to reduce pain and swelling. Avoid alcohol and caffeine.  Get plenty of rest.  Follow up with you PCP in 1-2 weeks if symptoms persist.  If symptoms worsen please return to ED for further evaluation.

## 2024-07-12 NOTE — ED Provider Notes (Signed)
 [facility] Emergency Department Provider Note     Event Date/Time   First MD Initiated Contact with Patient 07/12/24 1041     (approximate)   History   Head Injury   HPI  Gabriel Moore is a 38 y.o. male patient comes in for evaluation of head injury while at work.  Patient reports a metal piece of equipment fell from a shelf above onto his head.  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  Reports right posterior head pain/headache with radiation to front of head and down right side of neck.  Symptoms have eased off a bit since injury occurred.  Denies vision changes, confusion, or dizziness.     Physical Exam   Triage Vital Signs: ED Triage Vitals  Encounter Vitals Group     BP 07/12/24 0956 (!) 170/111     Girls Systolic BP Percentile --      Girls Diastolic BP Percentile --      Boys Systolic BP Percentile --      Boys Diastolic BP Percentile --      Pulse Rate 07/12/24 0956 69     Resp 07/12/24 0956 18     Temp 07/12/24 0956 98.2 F (36.8 C)     Temp Source 07/12/24 0956 Oral     SpO2 07/12/24 0956 100 %     Weight 07/12/24 0958 260 lb (117.9 kg)     Height 07/12/24 0958 6' 3 (1.905 m)     Head Circumference --      Peak Flow --      Pain Score 07/12/24 1006 7     Pain Loc --      Pain Education --      Exclude from Growth Chart --     Most recent vital signs: Vitals:   07/12/24 0956  BP: (!) 170/111  Pulse: 69  Resp: 18  Temp: 98.2 F (36.8 C)  SpO2: 100%    General: Well appearing and comfortable. Alert and oriented. INAD.  Stutter at baseline    Head:  NCAT.  Skin is intact.  There is a small erythremic bruise on posterior parietal scalp.  Mild tenderness to palpation. Eyes:  PERRLA. EOMI.  Ears:  No postauricular ecchymosis. Neck:   No cervical spine tenderness to palpation. Full ROM without difficulty.  Tenderness to right SCM. CV:  Good peripheral perfusion. RRR.  RESP:  Normal effort. LCTAB.   NEURO: Cranial nerves II-XII intact. No focal deficits. Speech clear. Sensation and motor function intact. 5/5 muscle strength of bilateral UE & LE.  Smooth coordination with finger-nose test. Gait is steady.   ED Results / Procedures / Treatments   Labs (all labs ordered are listed, but only abnormal results are displayed) Labs Reviewed - No data to display  RADIOLOGY  I personally viewed and evaluated these images as part of my medical decision making, as well as reviewing the written report by the radiologist.  ED Provider Interpretation: CT head and cervical spine normal  CT Head Wo Contrast Result Date: 07/12/2024 CLINICAL DATA:  Ataxia, cervical trauma; Head trauma, minor, normal mental status (Age 25-64y). Headache after a metal object fell on the patient's head. EXAM: CT HEAD WITHOUT CONTRAST CT CERVICAL SPINE WITHOUT CONTRAST TECHNIQUE: Multidetector CT imaging of the head and cervical spine was performed following the standard protocol without intravenous contrast. Multiplanar CT image reconstructions of the cervical spine were also generated. RADIATION DOSE REDUCTION: This  exam was performed according to the departmental dose-optimization program which includes automated exposure control, adjustment of the mA and/or kV according to patient size and/or use of iterative reconstruction technique. COMPARISON:  Head CT 01/16/2011 FINDINGS: CT HEAD FINDINGS Brain: There is no evidence of an acute infarct, intracranial hemorrhage, mass, midline shift, or extra-axial fluid collection. Cerebral volume is normal. The ventricles are normal in size. Vascular: No hyperdense vessel. Skull: No fracture or suspicious lesion. Sinuses/Orbits: Visualized paranasal sinuses and mastoid air cells are clear. Unremarkable included orbits. Other: None. CT CERVICAL SPINE FINDINGS Alignment: Mild reversal of the normal cervical lordosis. No listhesis. Skull base and vertebrae: No acute fracture or suspicious  lesion. Soft tissues and spinal canal: No prevertebral fluid or swelling. No visible canal hematoma. Disc levels: Preserved disc heights without evidence of significant degenerative changes. Upper chest: Clear lung apices. Other: None. IMPRESSION: No evidence of acute intracranial or cervical spine injury. Electronically Signed   By: Dasie Hamburg M.D.   On: 07/12/2024 11:16   CT Cervical Spine Wo Contrast Result Date: 07/12/2024 CLINICAL DATA:  Ataxia, cervical trauma; Head trauma, minor, normal mental status (Age 85-64y). Headache after a metal object fell on the patient's head. EXAM: CT HEAD WITHOUT CONTRAST CT CERVICAL SPINE WITHOUT CONTRAST TECHNIQUE: Multidetector CT imaging of the head and cervical spine was performed following the standard protocol without intravenous contrast. Multiplanar CT image reconstructions of the cervical spine were also generated. RADIATION DOSE REDUCTION: This exam was performed according to the departmental dose-optimization program which includes automated exposure control, adjustment of the mA and/or kV according to patient size and/or use of iterative reconstruction technique. COMPARISON:  Head CT 01/16/2011 FINDINGS: CT HEAD FINDINGS Brain: There is no evidence of an acute infarct, intracranial hemorrhage, mass, midline shift, or extra-axial fluid collection. Cerebral volume is normal. The ventricles are normal in size. Vascular: No hyperdense vessel. Skull: No fracture or suspicious lesion. Sinuses/Orbits: Visualized paranasal sinuses and mastoid air cells are clear. Unremarkable included orbits. Other: None. CT CERVICAL SPINE FINDINGS Alignment: Mild reversal of the normal cervical lordosis. No listhesis. Skull base and vertebrae: No acute fracture or suspicious lesion. Soft tissues and spinal canal: No prevertebral fluid or swelling. No visible canal hematoma. Disc levels: Preserved disc heights without evidence of significant degenerative changes. Upper chest: Clear lung  apices. Other: None. IMPRESSION: No evidence of acute intracranial or cervical spine injury. Electronically Signed   By: Dasie Hamburg M.D.   On: 07/12/2024 11:16    PROCEDURES:  Critical Care performed: No  Procedures   MEDICATIONS ORDERED IN ED: Medications - No data to display   IMPRESSION / MDM / ASSESSMENT AND PLAN / ED COURSE  I reviewed the triage vital signs and the nursing notes.                              Clinical Course as of 07/12/24 1202  Wed Jul 12, 2024  1136 CT Head Wo Contrast IMPRESSION: No evidence of acute intracranial or cervical spine injury.   [MH]  1137 CT Cervical Spine Wo Contrast [MH]  1137 Patient declining pain medication  [MH]    Clinical Course User Index [MH] Margrette Monte A, PA-C    38 y.o. male presents to the emergency department for evaluation and treatment of head injury. See HPI for further details.   Differential diagnosis includes, but is not limited to ICH, TBI, hematoma, contusion  Patient's presentation is most consistent  with acute complicated illness / injury requiring diagnostic workup.  Patient is alert and oriented.  He is hemodynamically stable.  Physical exam findings are stated above.  Normal neuroexam.  CT head and cervical spine are reassuring.  Presentation clinically consistent with contusion to posterior head.  Advised application of ice to the area to help with swelling.  Head injury precautions and symptomatic treatment at home education provided to the patient in which she verbalized understanding.  He is in stable condition for discharge home.  ED return precautions discussed.  Encouraged to follow-up with primary care provider in 1 week.  All questions and concerns were addressed during this ED visit.  FINAL CLINICAL IMPRESSION(S) / ED DIAGNOSES   Final diagnoses:  Injury of head, initial encounter   Rx / DC Orders   ED Discharge Orders     None      Note:  This document was prepared using Dragon  voice recognition software and may include unintentional dictation errors.    Margrette, Kryslyn Helbig A, PA-C 07/12/24 1203    Ernest Ronal BRAVO, MD 07/13/24 [phone]
